# Patient Record
Sex: Female | Born: 1955 | Race: White | Hispanic: No | Marital: Married | Smoking: Never smoker
Health system: Southern US, Community
[De-identification: ages and names within clinical notes are randomized; demographics above are authoritative.]

## PROBLEM LIST (undated history)

## (undated) DIAGNOSIS — M1990 Unspecified osteoarthritis, unspecified site: Secondary | ICD-10-CM

## (undated) DIAGNOSIS — R011 Cardiac murmur, unspecified: Secondary | ICD-10-CM

## (undated) DIAGNOSIS — E119 Type 2 diabetes mellitus without complications: Secondary | ICD-10-CM

## (undated) DIAGNOSIS — I10 Essential (primary) hypertension: Secondary | ICD-10-CM

## (undated) HISTORY — PX: ECTOPIC PREGNANCY SURGERY: SHX613

---

## 2005-08-15 ENCOUNTER — Ambulatory Visit: Payer: Self-pay | Admitting: Unknown Physician Specialty

## 2007-01-15 ENCOUNTER — Ambulatory Visit: Payer: Self-pay | Admitting: Unknown Physician Specialty

## 2007-02-19 ENCOUNTER — Ambulatory Visit: Payer: Self-pay | Admitting: Gastroenterology

## 2008-11-17 ENCOUNTER — Ambulatory Visit: Payer: Self-pay | Admitting: Unknown Physician Specialty

## 2009-11-20 ENCOUNTER — Ambulatory Visit: Payer: Self-pay | Admitting: Unknown Physician Specialty

## 2009-11-23 ENCOUNTER — Ambulatory Visit: Payer: Self-pay | Admitting: Unknown Physician Specialty

## 2010-12-06 ENCOUNTER — Ambulatory Visit: Payer: Self-pay | Admitting: Unknown Physician Specialty

## 2011-05-30 ENCOUNTER — Emergency Department: Payer: Self-pay | Admitting: Emergency Medicine

## 2011-05-30 LAB — COMPREHENSIVE METABOLIC PANEL
Albumin: 4.2 g/dL (ref 3.4–5.0)
BUN: 11 mg/dL (ref 7–18)
Calcium, Total: 9.3 mg/dL (ref 8.5–10.1)
Chloride: 103 mmol/L (ref 98–107)
EGFR (African American): >60
Glucose: 112 mg/dL — ABNORMAL HIGH (ref 65–99)
SGOT(AST): 16 U/L (ref 15–37)
SGPT (ALT): 27 U/L
Total Protein: 7.3 g/dL (ref 6.4–8.2)

## 2011-05-30 LAB — CBC
HCT: 36.9 % (ref 35.0–47.0)
MCH: 28.1 pg (ref 26.0–34.0)
MCHC: 32.9 g/dL (ref 32.0–36.0)
MCV: 86 fL (ref 80–100)
Platelet: 343 10*3/uL (ref 150–440)
RDW: 13.3 % (ref 11.5–14.5)

## 2011-05-30 LAB — TROPONIN I: Troponin-I: <0.02 ng/mL

## 2016-04-04 ENCOUNTER — Other Ambulatory Visit: Payer: Self-pay | Admitting: Family Medicine

## 2016-04-04 DIAGNOSIS — N644 Mastodynia: Secondary | ICD-10-CM

## 2016-04-09 ENCOUNTER — Ambulatory Visit
Admission: RE | Admit: 2016-04-09 | Discharge: 2016-04-09 | Disposition: A | Discharge status: Home / Self Care | Payer: BLUE CROSS/BLUE SHIELD | Source: Ambulatory Visit | Attending: Family Medicine | Admitting: Family Medicine

## 2016-04-09 ENCOUNTER — Other Ambulatory Visit: Payer: Self-pay | Admitting: *Deleted

## 2016-04-09 ENCOUNTER — Ambulatory Visit
Admission: RE | Admit: 2016-04-09 | Discharge: 2016-04-09 | Disposition: A | Discharge status: Home / Self Care | Payer: Self-pay | Source: Ambulatory Visit | Attending: *Deleted | Admitting: *Deleted

## 2016-04-09 DIAGNOSIS — N644 Mastodynia: Secondary | ICD-10-CM

## 2016-04-09 DIAGNOSIS — Z9289 Personal history of other medical treatment: Secondary | ICD-10-CM

## 2016-04-17 ENCOUNTER — Ambulatory Visit: Payer: Self-pay

## 2016-04-17 ENCOUNTER — Other Ambulatory Visit: Payer: Self-pay

## 2017-08-11 ENCOUNTER — Other Ambulatory Visit: Payer: Self-pay | Admitting: Obstetrics and Gynecology

## 2017-08-11 DIAGNOSIS — Z1231 Encounter for screening mammogram for malignant neoplasm of breast: Secondary | ICD-10-CM

## 2017-08-14 ENCOUNTER — Other Ambulatory Visit: Payer: Self-pay | Admitting: Obstetrics & Gynecology

## 2017-08-14 DIAGNOSIS — R1900 Intra-abdominal and pelvic swelling, mass and lump, unspecified site: Secondary | ICD-10-CM

## 2017-08-18 ENCOUNTER — Ambulatory Visit
Admission: RE | Admit: 2017-08-18 | Discharge: 2017-08-18 | Disposition: A | Discharge status: Home / Self Care | Payer: BLUE CROSS/BLUE SHIELD | Source: Ambulatory Visit | Attending: Obstetrics & Gynecology | Admitting: Obstetrics & Gynecology

## 2017-08-18 DIAGNOSIS — R1900 Intra-abdominal and pelvic swelling, mass and lump, unspecified site: Secondary | ICD-10-CM | POA: Diagnosis present

## 2017-08-18 DIAGNOSIS — K760 Fatty (change of) liver, not elsewhere classified: Secondary | ICD-10-CM | POA: Diagnosis not present

## 2017-08-18 DIAGNOSIS — N83201 Unspecified ovarian cyst, right side: Secondary | ICD-10-CM | POA: Diagnosis not present

## 2017-08-18 DIAGNOSIS — N83202 Unspecified ovarian cyst, left side: Secondary | ICD-10-CM | POA: Insufficient documentation

## 2017-08-18 HISTORY — DX: Essential (primary) hypertension: I10

## 2017-08-18 HISTORY — DX: Type 2 diabetes mellitus without complications: E11.9

## 2017-08-18 MED ORDER — IOPAMIDOL (ISOVUE-300) INJECTION 61%
100.0000 mL | Freq: Once | INTRAVENOUS | Status: AC | PRN
  Administered 2017-08-18: 100 mL via INTRAVENOUS

## 2017-08-19 ENCOUNTER — Other Ambulatory Visit: Payer: Self-pay

## 2017-08-19 ENCOUNTER — Ambulatory Visit: Admission: RE | Admit: 2017-08-19 | Payer: BLUE CROSS/BLUE SHIELD | Source: Ambulatory Visit

## 2017-08-19 ENCOUNTER — Encounter
Admission: RE | Admit: 2017-08-19 | Discharge: 2017-08-19 | Disposition: A | Discharge status: Home / Self Care | Payer: BLUE CROSS/BLUE SHIELD | Source: Ambulatory Visit | Attending: Obstetrics & Gynecology | Admitting: Obstetrics & Gynecology

## 2017-08-19 DIAGNOSIS — R9431 Abnormal electrocardiogram [ECG] [EKG]: Secondary | ICD-10-CM | POA: Diagnosis not present

## 2017-08-19 DIAGNOSIS — Z01810 Encounter for preprocedural cardiovascular examination: Secondary | ICD-10-CM | POA: Insufficient documentation

## 2017-08-19 DIAGNOSIS — Z01812 Encounter for preprocedural laboratory examination: Secondary | ICD-10-CM | POA: Diagnosis present

## 2017-08-19 HISTORY — DX: Cardiac murmur, unspecified: R01.1

## 2017-08-19 HISTORY — DX: Unspecified osteoarthritis, unspecified site: M19.90

## 2017-08-19 LAB — TYPE AND SCREEN
ABO/RH(D): A POS
Antibody Screen: NEGATIVE

## 2017-08-19 MED ORDER — CEFAZOLIN SODIUM-DEXTROSE 2-4 GM/100ML-% IV SOLN
2.0000 g | INTRAVENOUS | Status: AC
  Administered 2017-08-20: 2 g via INTRAVENOUS

## 2017-08-19 NOTE — Patient Instructions (Signed)
Your procedure is scheduled on: tomorrow  Report to Day Surgery. 6:00   Remember: Instructions that are not followed completely may result in serious medical risk, up to and including death, or upon the discretion of your surgeon and anesthesiologist your surgery may need to be rescheduled.     _X__ 1. Do not eat food after midnight the night before your procedure.                 No gum chewing or hard candies. You may drink clear liquids up to 2 hours                 before you are scheduled to arrive for your surgery- DO not drink clear                 liquids within 2 hours of the start of your surgery.                 Clear Liquids include:  water, apple juice without pulp, clear carbohydrate                 drink such as Clearfast of Gartorade, Black Coffee or Tea (Do not add                 anything to coffee or tea).  __X__2.  On the morning of surgery brush your teeth with toothpaste and water, you  may rinse your mouth with mouthwash if you wish.  Do not swallow any              toothpaste of mouthwash.     ___ 3.  No Alcohol for 24 hours before or after surgery.   ___ 4.  Do Not Smoke or use e-cigarettes For 24 Hours Prior to Your Surgery.                 Do not use any chewable tobacco products for at least 6 hours prior to                 surgery.  ____  5.  Bring all medications with you on the day of surgery if instructed.   ____  6.  Notify your doctor if there is any change in your medical condition      (cold, fever, infections).     Do not wear jewelry, make-up, hairpins, clips or nail polish. Do not wear lotions, powders, or perfumes. You may wear deodorant. Do not shave 48 hours prior to surgery. Men may shave face and neck. Do not bring valuables to the hospital.    [facility] is not responsible for any belongings or valuables.  Contacts, dentures or bridgework may not be worn into surgery. Leave your suitcase in the car. After surgery  it may be brought to your room. For patients admitted to the hospital, discharge time is determined by your treatment team.   Patients discharged the day of surgery will not be allowed to drive home.   Please read over the following fact sheets that you were given:   _x___ Take these medicines the morning of surgery with A SIP OF WATER:    1. none  2.   3.   4.  5.  6.  ____ Fleet Enema (as directed)   __x__ Use CHG Soap as directed  ____ Use inhalers on the day of surgery  __x__ Stop metformin 2 days prior to surgery ( already stopped)   ____ Take 1/2 of  usual insulin dose the night before surgery. No insulin the morning          of surgery.   __x__ Stop aspirin now  ___x_ Stop Anti-inflammatories ibuprofen now   ___x_ Stop supplements until after surgery.    ____ Bring C-Pap to the hospital.

## 2017-08-20 ENCOUNTER — Other Ambulatory Visit: Payer: Self-pay

## 2017-08-20 ENCOUNTER — Ambulatory Visit: Payer: BLUE CROSS/BLUE SHIELD | Admitting: Certified Registered"

## 2017-08-20 ENCOUNTER — Ambulatory Visit
Admission: RE | Admit: 2017-08-20 | Discharge: 2017-08-20 | Disposition: A | Discharge status: Home / Self Care | Payer: BLUE CROSS/BLUE SHIELD | Source: Ambulatory Visit | Attending: Obstetrics & Gynecology | Admitting: Obstetrics & Gynecology

## 2017-08-20 ENCOUNTER — Encounter: Payer: Self-pay | Admitting: *Deleted

## 2017-08-20 ENCOUNTER — Encounter
Admission: RE | Disposition: A | Discharge status: Home / Self Care | Payer: Self-pay | Source: Ambulatory Visit | Attending: Obstetrics & Gynecology

## 2017-08-20 DIAGNOSIS — Z79899 Other long term (current) drug therapy: Secondary | ICD-10-CM | POA: Diagnosis not present

## 2017-08-20 DIAGNOSIS — N83201 Unspecified ovarian cyst, right side: Secondary | ICD-10-CM

## 2017-08-20 DIAGNOSIS — D271 Benign neoplasm of left ovary: Secondary | ICD-10-CM | POA: Diagnosis not present

## 2017-08-20 DIAGNOSIS — Z7984 Long term (current) use of oral hypoglycemic drugs: Secondary | ICD-10-CM | POA: Diagnosis not present

## 2017-08-20 DIAGNOSIS — N839 Noninflammatory disorder of ovary, fallopian tube and broad ligament, unspecified: Secondary | ICD-10-CM | POA: Diagnosis present

## 2017-08-20 DIAGNOSIS — D270 Benign neoplasm of right ovary: Secondary | ICD-10-CM | POA: Diagnosis not present

## 2017-08-20 DIAGNOSIS — I10 Essential (primary) hypertension: Secondary | ICD-10-CM | POA: Diagnosis not present

## 2017-08-20 DIAGNOSIS — E119 Type 2 diabetes mellitus without complications: Secondary | ICD-10-CM | POA: Diagnosis not present

## 2017-08-20 DIAGNOSIS — Z7982 Long term (current) use of aspirin: Secondary | ICD-10-CM | POA: Insufficient documentation

## 2017-08-20 DIAGNOSIS — N83202 Unspecified ovarian cyst, left side: Secondary | ICD-10-CM

## 2017-08-20 HISTORY — PX: LAPAROSCOPIC UNILATERAL SALPINGECTOMY: SHX5934

## 2017-08-20 LAB — GLUCOSE, CAPILLARY
GLUCOSE-CAPILLARY: 155 mg/dL — AB (ref 65–99)
GLUCOSE-CAPILLARY: 154 mg/dL — AB (ref 65–99)

## 2017-08-20 LAB — ABO/RH: ABO/RH(D): A POS

## 2017-08-20 SURGERY — OOPHORECTOMY, LAPAROSCOPIC
Anesthesia: General | Wound class: Clean Contaminated

## 2017-08-20 MED ORDER — ROCURONIUM BROMIDE 100 MG/10ML IV SOLN
INTRAVENOUS | Status: DC | PRN
  Administered 2017-08-20: 40 mg via INTRAVENOUS
  Administered 2017-08-20: 5 mg via INTRAVENOUS

## 2017-08-20 MED ORDER — MIDAZOLAM HCL 2 MG/2ML IJ SOLN
INTRAMUSCULAR | Status: AC
  Filled 2017-08-20: qty 2

## 2017-08-20 MED ORDER — ONDANSETRON HCL 4 MG/2ML IJ SOLN: 4.0000 mg | Freq: Once | INTRAMUSCULAR | Status: DC | PRN

## 2017-08-20 MED ORDER — PROPOFOL 10 MG/ML IV BOLUS
INTRAVENOUS | Status: DC | PRN
  Administered 2017-08-20: 140 mg via INTRAVENOUS

## 2017-08-20 MED ORDER — ACETAMINOPHEN 500 MG PO TABS
1000.0000 mg | ORAL_TABLET | ORAL | Status: AC
  Administered 2017-08-20: 1000 mg via ORAL

## 2017-08-20 MED ORDER — SEVOFLURANE IN SOLN
RESPIRATORY_TRACT | Status: AC
  Filled 2017-08-20: qty 250

## 2017-08-20 MED ORDER — FAMOTIDINE 20 MG PO TABS
ORAL_TABLET | ORAL | Status: AC
  Filled 2017-08-20: qty 1

## 2017-08-20 MED ORDER — CELECOXIB 200 MG PO CAPS
400.0000 mg | ORAL_CAPSULE | ORAL | Status: AC
  Administered 2017-08-20: 400 mg via ORAL

## 2017-08-20 MED ORDER — LACTATED RINGERS IV SOLN
INTRAVENOUS | Status: DC | PRN
  Administered 2017-08-20: 07:00:00 via INTRAVENOUS

## 2017-08-20 MED ORDER — KETOROLAC TROMETHAMINE 30 MG/ML IJ SOLN
INTRAMUSCULAR | Status: DC | PRN
  Administered 2017-08-20: 30 mg via INTRAVENOUS

## 2017-08-20 MED ORDER — IBUPROFEN 800 MG PO TABS: 800.0000 mg | ORAL_TABLET | Freq: Four times a day (QID) | ORAL | 1 refills | Status: AC

## 2017-08-20 MED ORDER — ACETAMINOPHEN 500 MG PO TABS
ORAL_TABLET | ORAL | Status: AC
  Filled 2017-08-20: qty 2

## 2017-08-20 MED ORDER — OXYCODONE HCL 5 MG PO TABS: 5.0000 mg | ORAL_TABLET | ORAL | 0 refills | Status: DC | PRN

## 2017-08-20 MED ORDER — IBUPROFEN 800 MG PO TABS: 800.0000 mg | ORAL_TABLET | Freq: Four times a day (QID) | ORAL | 1 refills | Status: DC

## 2017-08-20 MED ORDER — HEPARIN SODIUM (PORCINE) 5000 UNIT/ML IJ SOLN
INTRAMUSCULAR | Status: AC
  Filled 2017-08-20: qty 1

## 2017-08-20 MED ORDER — CEFAZOLIN SODIUM-DEXTROSE 2-4 GM/100ML-% IV SOLN
INTRAVENOUS | Status: AC
  Filled 2017-08-20: qty 100

## 2017-08-20 MED ORDER — MORPHINE SULFATE (PF) 4 MG/ML IV SOLN: 1.0000 mg | INTRAVENOUS | Status: DC | PRN

## 2017-08-20 MED ORDER — FENTANYL CITRATE (PF) 250 MCG/5ML IJ SOLN
INTRAMUSCULAR | Status: AC
  Filled 2017-08-20: qty 5

## 2017-08-20 MED ORDER — LIDOCAINE HCL (CARDIAC) PF 100 MG/5ML IV SOSY
PREFILLED_SYRINGE | INTRAVENOUS | Status: DC | PRN
  Administered 2017-08-20: 60 mg via INTRAVENOUS

## 2017-08-20 MED ORDER — DEXAMETHASONE SODIUM PHOSPHATE 10 MG/ML IJ SOLN
4.0000 mg | INTRAMUSCULAR | Status: AC
  Administered 2017-08-20: 4 mg via INTRAVENOUS

## 2017-08-20 MED ORDER — PHENYLEPHRINE HCL 10 MG/ML IJ SOLN
INTRAMUSCULAR | Status: DC | PRN
  Administered 2017-08-20 (×2): 100 ug via INTRAVENOUS
  Administered 2017-08-20: 150 ug via INTRAVENOUS
  Administered 2017-08-20 (×2): 100 ug via INTRAVENOUS
  Administered 2017-08-20: 150 ug via INTRAVENOUS
  Administered 2017-08-20: 100 ug via INTRAVENOUS

## 2017-08-20 MED ORDER — PROPOFOL 10 MG/ML IV BOLUS
INTRAVENOUS | Status: AC
  Filled 2017-08-20: qty 40

## 2017-08-20 MED ORDER — SUGAMMADEX SODIUM 200 MG/2ML IV SOLN
INTRAVENOUS | Status: DC | PRN
  Administered 2017-08-20: 122 mg via INTRAVENOUS

## 2017-08-20 MED ORDER — LACTATED RINGERS IV SOLN: INTRAVENOUS | Status: DC

## 2017-08-20 MED ORDER — FENTANYL CITRATE (PF) 100 MCG/2ML IJ SOLN
INTRAMUSCULAR | Status: DC | PRN
  Administered 2017-08-20 (×2): 50 ug via INTRAVENOUS

## 2017-08-20 MED ORDER — EPHEDRINE SULFATE 50 MG/ML IJ SOLN
INTRAMUSCULAR | Status: DC | PRN
  Administered 2017-08-20: 7.5 mg via INTRAVENOUS

## 2017-08-20 MED ORDER — FAMOTIDINE 20 MG PO TABS
20.0000 mg | ORAL_TABLET | Freq: Once | ORAL | Status: AC
  Administered 2017-08-20: 20 mg via ORAL

## 2017-08-20 MED ORDER — SODIUM CHLORIDE 0.9 % IV SOLN
INTRAVENOUS | Status: DC
  Administered 2017-08-20: 07:00:00 via INTRAVENOUS

## 2017-08-20 MED ORDER — ALBUTEROL SULFATE HFA 108 (90 BASE) MCG/ACT IN AERS
INHALATION_SPRAY | RESPIRATORY_TRACT | Status: DC | PRN
  Administered 2017-08-20: 6 via RESPIRATORY_TRACT

## 2017-08-20 MED ORDER — HEPARIN SODIUM (PORCINE) 5000 UNIT/ML IJ SOLN
5000.0000 [IU] | INTRAMUSCULAR | Status: AC
  Administered 2017-08-20: 5000 [IU] via SUBCUTANEOUS

## 2017-08-20 MED ORDER — OXYCODONE HCL 5 MG PO TABS: 5.0000 mg | ORAL_TABLET | ORAL | 0 refills | Status: AC | PRN

## 2017-08-20 MED ORDER — ONDANSETRON HCL 4 MG/2ML IJ SOLN
INTRAMUSCULAR | Status: DC | PRN
  Administered 2017-08-20: 4 mg via INTRAVENOUS

## 2017-08-20 MED ORDER — MIDAZOLAM HCL 2 MG/2ML IJ SOLN
INTRAMUSCULAR | Status: DC | PRN
  Administered 2017-08-20 (×2): 1 mg via INTRAVENOUS

## 2017-08-20 MED ORDER — FENTANYL CITRATE (PF) 100 MCG/2ML IJ SOLN
INTRAMUSCULAR | Status: AC
  Administered 2017-08-20: 25 ug via INTRAVENOUS
  Filled 2017-08-20: qty 2

## 2017-08-20 MED ORDER — GABAPENTIN 300 MG PO CAPS
ORAL_CAPSULE | ORAL | Status: AC
  Filled 2017-08-20: qty 2

## 2017-08-20 MED ORDER — FENTANYL CITRATE (PF) 100 MCG/2ML IJ SOLN
25.0000 ug | INTRAMUSCULAR | Status: DC | PRN
  Administered 2017-08-20 (×3): 25 ug via INTRAVENOUS

## 2017-08-20 MED ORDER — GABAPENTIN 300 MG PO CAPS
600.0000 mg | ORAL_CAPSULE | ORAL | Status: AC
  Administered 2017-08-20: 600 mg via ORAL

## 2017-08-20 MED ORDER — CELECOXIB 200 MG PO CAPS
ORAL_CAPSULE | ORAL | Status: AC
  Filled 2017-08-20: qty 2

## 2017-08-20 MED ORDER — DEXAMETHASONE SODIUM PHOSPHATE 10 MG/ML IJ SOLN
INTRAMUSCULAR | Status: AC
  Filled 2017-08-20: qty 1

## 2017-08-20 SURGICAL SUPPLY — 65 items
BACTOSHIELD CHG 4% 4OZ (MISCELLANEOUS) ×1
BAG URINE DRAINAGE (UROLOGICAL SUPPLIES) ×5 IMPLANT
BLADE SURG SZ11 CARB STEEL (BLADE) ×5 IMPLANT
CANISTER SUCT 1200ML W/VALVE (MISCELLANEOUS) ×5 IMPLANT
CATH FOLEY 2WAY  5CC 16FR (CATHETERS) ×2
CATH URTH 16FR FL 2W BLN LF (CATHETERS) ×3 IMPLANT
CHLORAPREP W/TINT 26ML (MISCELLANEOUS) ×5 IMPLANT
CNTNR SPEC 2.5X3XGRAD LEK (MISCELLANEOUS) ×9
CONT SPEC 4OZ STER OR WHT (MISCELLANEOUS) ×6
CONTAINER SPEC 2.5X3XGRAD LEK (MISCELLANEOUS) ×9 IMPLANT
DEFOGGER SCOPE WARMER CLEARIFY (MISCELLANEOUS) ×5 IMPLANT
DERMABOND ADVANCED (GAUZE/BANDAGES/DRESSINGS) ×2
DERMABOND ADVANCED .7 DNX12 (GAUZE/BANDAGES/DRESSINGS) ×3 IMPLANT
DEVICE SUTURE ENDOST 10MM (ENDOMECHANICALS) IMPLANT
DRAPE LEGGINS SURG 28X43 STRL (DRAPES) ×5 IMPLANT
DRAPE SHEET LG 3/4 BI-LAMINATE (DRAPES) ×5 IMPLANT
DRAPE UNDER BUTTOCK W/FLU (DRAPES) ×5 IMPLANT
DRSG TELFA 4X3 1S NADH ST (GAUZE/BANDAGES/DRESSINGS) IMPLANT
ELECT REM PT RETURN 9FT ADLT (ELECTROSURGICAL) ×5
ELECTRODE REM PT RTRN 9FT ADLT (ELECTROSURGICAL) ×3 IMPLANT
GLOVE PI ORTHOPRO 6.5 (GLOVE) ×2
GLOVE PI ORTHOPRO STRL 6.5 (GLOVE) ×3 IMPLANT
GLOVE SURG SYN 6.5 ES PF (GLOVE) ×15 IMPLANT
GOWN STRL REUS W/ TWL LRG LVL3 (GOWN DISPOSABLE) ×9 IMPLANT
GOWN STRL REUS W/ TWL XL LVL3 (GOWN DISPOSABLE) ×3 IMPLANT
GOWN STRL REUS W/TWL LRG LVL3 (GOWN DISPOSABLE) ×6
GOWN STRL REUS W/TWL XL LVL3 (GOWN DISPOSABLE) ×2
GRASPER SUT TROCAR 14GX15 (MISCELLANEOUS) ×5 IMPLANT
IRRIGATION STRYKERFLOW (MISCELLANEOUS) IMPLANT
IRRIGATOR STRYKERFLOW (MISCELLANEOUS)
IV LACTATED RINGERS 1000ML (IV SOLUTION) IMPLANT
KIT PINK PAD W/HEAD ARE REST (MISCELLANEOUS) ×5
KIT PINK PAD W/HEAD ARM REST (MISCELLANEOUS) ×3 IMPLANT
KIT TURNOVER CYSTO (KITS) ×5 IMPLANT
L-HOOK LAP DISP 36CM (ELECTROSURGICAL)
LABEL OR SOLS (LABEL) IMPLANT
LHOOK LAP DISP 36CM (ELECTROSURGICAL) IMPLANT
LIGASURE VESSEL 5MM BLUNT TIP (ELECTROSURGICAL) ×5 IMPLANT
MANIPULATOR VCARE LG CRV RETR (MISCELLANEOUS) IMPLANT
MANIPULATOR VCARE SML CRV RETR (MISCELLANEOUS) IMPLANT
MANIPULATOR VCARE STD CRV RETR (MISCELLANEOUS) IMPLANT
NS IRRIG 500ML POUR BTL (IV SOLUTION) ×5 IMPLANT
PACK LAP CHOLECYSTECTOMY (MISCELLANEOUS) ×5 IMPLANT
PAD OB MATERNITY 4.3X12.25 (PERSONAL CARE ITEMS) ×5 IMPLANT
PAD PREP 24X41 OB/GYN DISP (PERSONAL CARE ITEMS) ×5 IMPLANT
PENCIL ELECTRO HAND CTR (MISCELLANEOUS) ×5 IMPLANT
POUCH SPECIMEN RETRIEVAL 10MM (ENDOMECHANICALS) IMPLANT
SCRUB CHG 4% DYNA-HEX 4OZ (MISCELLANEOUS) ×4 IMPLANT
SET CYSTO W/LG BORE CLAMP LF (SET/KITS/TRAYS/PACK) IMPLANT
SET YANKAUER POOLE SUCT (MISCELLANEOUS) IMPLANT
SLEEVE ENDOPATH XCEL 5M (ENDOMECHANICALS) ×5 IMPLANT
STRAP SAFETY 5IN WIDE (MISCELLANEOUS) ×5 IMPLANT
SURGILUBE 2OZ TUBE FLIPTOP (MISCELLANEOUS) ×5 IMPLANT
SUT ENDO VLOC 180-0-8IN (SUTURE) IMPLANT
SUT MNCRL 4-0 (SUTURE) ×2
SUT MNCRL 4-0 27XMFL (SUTURE) ×3
SUT VIC AB 0 CT1 36 (SUTURE) IMPLANT
SUT VIC AB 2-0 SH 27 (SUTURE) ×2
SUT VIC AB 2-0 SH 27XBRD (SUTURE) ×3 IMPLANT
SUTURE MNCRL 4-0 27XMF (SUTURE) ×3 IMPLANT
SYR 10ML LL (SYRINGE) ×5 IMPLANT
TROCAR ENDO BLADELESS 11MM (ENDOMECHANICALS) ×5 IMPLANT
TROCAR XCEL NON-BLD 5MMX100MML (ENDOMECHANICALS) ×5 IMPLANT
TUBING INSUF HEATED (TUBING) IMPLANT
TUBING INSUFFLATION (TUBING) ×5 IMPLANT

## 2017-08-20 NOTE — Anesthesia Preprocedure Evaluation (Signed)
Anesthesia Evaluation  Patient identified by MRN, date of birth, ID band Patient awake    Reviewed: Allergy & Precautions, H&P , NPO status , Patient's Chart, lab work & pertinent test results, reviewed documented beta blocker date and time   Airway Mallampati: II  TM Distance: >3 FB Neck ROM: full    Dental  (+) Teeth Intact   Pulmonary neg pulmonary ROS,    Pulmonary exam normal        Cardiovascular hypertension, negative cardio ROS Normal cardiovascular exam Rhythm:regular Rate:Normal     Neuro/Psych negative neurological ROS  negative psych ROS   GI/Hepatic negative GI ROS, Neg liver ROS,   Endo/Other  negative endocrine ROSdiabetes  Renal/GU negative Renal ROS  negative genitourinary   Musculoskeletal   Abdominal   Peds  Hematology negative hematology ROS (+)   Anesthesia Other Findings Past Medical History: No date: Arthritis No date: Diabetes mellitus without complication (HCC) No date: Heart murmur No date: Hypertension Past Surgical History: No date: ECTOPIC PREGNANCY SURGERY; Left     Comment:  partial loss of tube BMI    Body Mass Index:  25.41 kg/m     Reproductive/Obstetrics negative OB ROS                             Anesthesia Physical Anesthesia Plan  ASA: II  Anesthesia Plan: General ETT   Post-op Pain Management:    Induction:   PONV Risk Score and Plan: 4 or greater  Airway Management Planned:   Additional Equipment:   Intra-op Plan:   Post-operative Plan:   Informed Consent: I have reviewed the patients History and Physical, chart, labs and discussed the procedure including the risks, benefits and alternatives for the proposed anesthesia with the patient or authorized representative who has indicated his/her understanding and acceptance.   Dental Advisory Given  Plan Discussed with: CRNA  Anesthesia Plan Comments:         Anesthesia  Quick Evaluation

## 2017-08-20 NOTE — Anesthesia Post-op Follow-up Note (Signed)
Anesthesia QCDR form completed.        

## 2017-08-20 NOTE — OR Nursing (Signed)
Dr. Leonides Schanz aware [facility] [address] advises they did not receive e-script for ibuprofen and oxycodone.  Advises she will resend.

## 2017-08-20 NOTE — Discharge Instructions (Addendum)
Discharge instructions:  Call office if you have any of the following: fever >101 F, chills, excessive vaginal bleeding, incision drainage or problems, leg pain or redness, or any other concerns.   Activity: Do not lift > 10 lbs for 2 weeks.   No driving on narcotics or until you are confident you can slam on the brakes.   You may feel some pain in your upper right abdomen/rib and right shoulder.  This is from the gas in the abdomen for surgery. This will subside over time, please be patient!  Take 800mg  Ibuprofen and 1000mg  Tylenol around the clock, every 6 hours for at least the first 3-5 days.  After this you can take as needed.  This will help decrease inflammation and promote healing.  The narcotics you'll take just as needed, as they just trick your brain into thinking its not in pain.    Please don't limit yourself in terms of routine activity.  You will be able to do most things, although they may take longer to do or be a little painful.  You can do it!  Don't be a hero, but don't be a wimp either!   AMBULATORY SURGERY  DISCHARGE INSTRUCTIONS   1) The drugs that you were given will stay in your system until tomorrow so for the next 24 hours you should not:  A) Drive an automobile B) Make any legal decisions C) Drink any alcoholic beverage   2) You may resume regular meals tomorrow.  Today it is better to start with liquids and gradually work up to solid foods.  You may eat anything you prefer, but it is better to start with liquids, then soup and crackers, and gradually work up to solid foods.   3) Please notify your doctor immediately if you have any unusual bleeding, trouble breathing, redness and pain at the surgery site, drainage, fever, or pain not relieved by medication.    4) Additional Instructions:        Please contact your physician with any problems or Same Day Surgery at [phone], Monday through Friday 6 am to 4 pm, or [place] at [facility]  number at [phone].

## 2017-08-20 NOTE — OR Nursing (Signed)
Dr. Leonides Schanz in to see pt in posto 1135 am

## 2017-08-20 NOTE — H&P (Signed)
Preoperative History and Physical  Elizabeth Sloan is a 62 y.o. here for surgical management of pelvic pain and bilateral ovarian cysts.   No significant preoperative concerns.  Proposed surgery: BSO with possible TLH, PPLAD, omentectomy based on frozen pathology results.  Past Medical History:  Diagnosis Date  . Arthritis   . Diabetes mellitus without complication ([place])   . Heart murmur   . Hypertension    Past Surgical History:  Procedure Laterality Date  . ECTOPIC PREGNANCY SURGERY Left    partial loss of tube   OB History  No data available  Patient denies any other pertinent gynecologic issues.   No current facility-administered medications on file prior to encounter.    Current Outpatient Medications on File Prior to Encounter  Medication Sig Dispense Refill  . aspirin EC 81 MG tablet Take 81 mg by mouth daily.    . Azilsartan-Chlorthalidone (EDARBYCLOR) 40-12.5 MG TABS Take 1 tablet by mouth daily.    . Coenzyme Q10 (CO Q 10) 100 MG CAPS Take 1,000 mg by mouth daily.    . Empagliflozin-linaGLIPtin (GLYXAMBI) 25-5 MG TABS Take 1 tablet by mouth daily.    . Flaxseed, Linseed, (FLAX SEED OIL) 1300 MG CAPS Take 1,300 mg by mouth daily.    Marland Kitchen GLUCOSAMINE-CHONDROITIN-MSM PO Take 2 tablets by mouth daily.    Marland Kitchen ibuprofen (ADVIL,MOTRIN) 200 MG tablet Take 400 mg by mouth daily.    . metFORMIN (GLUCOPHAGE) 1000 MG tablet Take 1,000 mg by mouth 2 (two) times daily with a meal.    . OVER THE COUNTER MEDICATION Take 1 tablet by mouth daily. Super B Complex With Biotin 1000 mcg    . OVER THE COUNTER MEDICATION Take 2 capsules by mouth daily. Cholestene Supplement    . OVER THE COUNTER MEDICATION Take 1 tablet by mouth daily. Cinnamon Biotin Chromium Supplement    . Turmeric Curcumin 500 MG CAPS Take 500 mg by mouth daily.     Allergies  Allergen Reactions  . Statins Other (See Comments)    Joint pain    Social History:   reports that she has never smoked. She has never used  smokeless tobacco. She reports that she drank alcohol. She reports that she does not use drugs.  Family History  Problem Relation Age of Onset  . Breast cancer Other     Review of Systems: Noncontributory  PHYSICAL EXAM: Blood pressure 110/65, pulse 79, temperature 97.8 F (36.6 C), resp. rate 16, height 5\' 1"  (1.549 m), weight 61 kg (134 lb 8 oz), SpO2 95 %. General appearance - alert, well appearing, and in no distress Chest - clear to auscultation, no wheezes, rales or rhonchi, symmetric air entry Heart - normal rate and regular rhythm Abdomen - soft, nontender, nondistended, no masses or organomegaly Pelvic - examination not indicated Extremities - peripheral pulses normal, no pedal edema, no clubbing or cyanosis  Labs: Results for orders placed or performed during the hospital encounter of 08/20/17 (from the past 336 hour(s))  Glucose, capillary   Collection Time: 08/20/17  6:17 AM  Result Value Ref Range   Glucose-Capillary 155 (H) 65 - 99 mg/dL  ABO/Rh   Collection Time: 08/20/17  6:49 AM  Result Value Ref Range   ABO/RH(D)      A POS Performed at [facility], [address] [place], [place] [address]   Results for orders placed or performed during the hospital encounter of 08/19/17 (from the past 336 hour(s))  Type and screen [place]   Collection Time: 08/19/17  1:36 PM  Result Value Ref Range   ABO/RH(D) A POS    Antibody Screen NEG    Sample Expiration 09/02/2017    Extend sample reason      NO TRANSFUSIONS OR PREGNANCY IN THE PAST 3 MONTHS Performed at [facility], [address] [place], [place] [address]     Imaging Studies: Ct Chest W Contrast  Result Date: 08/18/2017 CLINICAL DATA:  Lower pelvic pain, ovarian cysts. Hx of partial resection of fallopian tube due to ectopic pregnancy. laparoscopic surgeries for endometriosis noted. Pt scheduled for pelvic surgery Wednesday EXAM: CT CHEST, ABDOMEN, AND PELVIS  WITH CONTRAST TECHNIQUE: Multidetector CT imaging of the chest, abdomen and pelvis was performed following the standard protocol during bolus administration of intravenous contrast. CONTRAST:  152mL ISOVUE-300 IOPAMIDOL (ISOVUE-300) INJECTION 61% COMPARISON:  None. FINDINGS: CT CHEST FINDINGS Cardiovascular: No significant vascular findings. Normal heart size. No pericardial effusion. Mediastinum/Nodes: No enlarged mediastinal, hilar, or axillary lymph nodes. Thyroid gland, trachea, and esophagus demonstrate no significant findings. Lungs/Pleura: No focal consolidation. 3 mm right middle lobe pulmonary nodule. No pleural effusion or pneumothorax. Musculoskeletal: No chest wall mass or suspicious bone lesions identified. All CT ABDOMEN PELVIS FINDINGS Hepatobiliary: Diffuse low attenuation of the liver as can be seen with hepatic steatosis. No focal liver abnormality is seen. No gallstones, gallbladder wall thickening, or biliary dilatation. Pancreas: Unremarkable. No pancreatic ductal dilatation or surrounding inflammatory changes. Spleen: Normal in size without focal abnormality. Adrenals/Urinary Tract: Adrenal glands are unremarkable. 10 mm left renal cyst. Kidneys are otherwise normal, without renal calculi, solid renal mass, or hydronephrosis. Bladder is unremarkable. Stomach/Bowel: Stomach is within normal limits. Appendix appears normal. No evidence of bowel wall thickening, distention, or inflammatory changes. Vascular/Lymphatic: No significant vascular findings are present. No enlarged abdominal or pelvic lymph nodes. Reproductive: 4.6 x 6.5 x 4.6 cm fluid attenuating left ovarian mass most consistent with a cyst with a punctate mural calcification. 3.1 x 2.6 x 3.5 cm hypodense, fluid attenuating right ovarian mass most consistent with a cyst. Normal uterus. Other: No abdominal wall hernia or abnormality. No abdominopelvic ascites. Musculoskeletal: No acute osseous abnormality. No aggressive osseous lesion.  Grade 1 anterolisthesis of L4 on L5 secondary to severe bilateral facet arthropathy. Bilateral facet arthropathy at L5-S1. IMPRESSION: 1. 4.6 x 6.5 x 4.6 cm left ovarian cystic mass with a punctate mural calcification. 2. 3.1 x 2.6 x 3.5 cm right ovarian cystic mass likely reflecting a simple cyst. 3. Hepatic steatosis. Electronically Signed   By: Kathreen Devoid   On: 08/18/2017 14:32   Ct Abdomen Pelvis W Contrast  Result Date: 08/18/2017 CLINICAL DATA:  Lower pelvic pain, ovarian cysts. Hx of partial resection of fallopian tube due to ectopic pregnancy. laparoscopic surgeries for endometriosis noted. Pt scheduled for pelvic surgery Wednesday EXAM: CT CHEST, ABDOMEN, AND PELVIS WITH CONTRAST TECHNIQUE: Multidetector CT imaging of the chest, abdomen and pelvis was performed following the standard protocol during bolus administration of intravenous contrast. CONTRAST:  133mL ISOVUE-300 IOPAMIDOL (ISOVUE-300) INJECTION 61% COMPARISON:  None. FINDINGS: CT CHEST FINDINGS Cardiovascular: No significant vascular findings. Normal heart size. No pericardial effusion. Mediastinum/Nodes: No enlarged mediastinal, hilar, or axillary lymph nodes. Thyroid gland, trachea, and esophagus demonstrate no significant findings. Lungs/Pleura: No focal consolidation. 3 mm right middle lobe pulmonary nodule. No pleural effusion or pneumothorax. Musculoskeletal: No chest wall mass or suspicious bone lesions identified. All CT ABDOMEN PELVIS FINDINGS Hepatobiliary: Diffuse low attenuation of the liver  as can be seen with hepatic steatosis. No focal liver abnormality is seen. No gallstones, gallbladder wall thickening, or biliary dilatation. Pancreas: Unremarkable. No pancreatic ductal dilatation or surrounding inflammatory changes. Spleen: Normal in size without focal abnormality. Adrenals/Urinary Tract: Adrenal glands are unremarkable. 10 mm left renal cyst. Kidneys are otherwise normal, without renal calculi, solid renal mass, or  hydronephrosis. Bladder is unremarkable. Stomach/Bowel: Stomach is within normal limits. Appendix appears normal. No evidence of bowel wall thickening, distention, or inflammatory changes. Vascular/Lymphatic: No significant vascular findings are present. No enlarged abdominal or pelvic lymph nodes. Reproductive: 4.6 x 6.5 x 4.6 cm fluid attenuating left ovarian mass most consistent with a cyst with a punctate mural calcification. 3.1 x 2.6 x 3.5 cm hypodense, fluid attenuating right ovarian mass most consistent with a cyst. Normal uterus. Other: No abdominal wall hernia or abnormality. No abdominopelvic ascites. Musculoskeletal: No acute osseous abnormality. No aggressive osseous lesion. Grade 1 anterolisthesis of L4 on L5 secondary to severe bilateral facet arthropathy. Bilateral facet arthropathy at L5-S1. IMPRESSION: 1. 4.6 x 6.5 x 4.6 cm left ovarian cystic mass with a punctate mural calcification. 2. 3.1 x 2.6 x 3.5 cm right ovarian cystic mass likely reflecting a simple cyst. 3. Hepatic steatosis. Electronically Signed   By: Kathreen Devoid   On: 08/18/2017 14:32    Assessment: Patient Active Problem List   Diagnosis Date Noted  . Ovarian cyst, bilateral 08/20/2017    Plan: Patient will undergo surgical management with lap BSO.   The risks of surgery were discussed in detail with the patient including but not limited to: bleeding which may require transfusion or reoperation; infection which may require antibiotics; injury to surrounding organs which may involve bowel, bladder, ureters ; need for additional procedures including laparoscopy or laparotomy; thromboembolic phenomenon, surgical site problems and other postoperative/anesthesia complications. Likelihood of success in alleviating the patient's condition was discussed. Routine postoperative instructions will be reviewed with the patient and her family in detail after surgery.  The patient concurred with the proposed plan, giving informed written  consent for the surgery.  Patient has been NPO since last night she will remain NPO for procedure.  Anesthesia and OR aware.  Preoperative prophylactic antibiotics and SCDs ordered on call to the OR.  To OR when ready.   ----- Larey Days, MD Attending Obstetrician and Gynecologist [facility], Department of [facility]

## 2017-08-20 NOTE — Op Note (Signed)
Elizabeth Sloan PROCEDURE DATE: 08/20/2017  PATIENT:  Elizabeth Sloan  62 y.o. female  PRE-OPERATIVE DIAGNOSIS:  ovarian mass  POST-OPERATIVE DIAGNOSIS:  ovarian mass  PROCEDURE:  Procedure(s): LAPAROSCOPIC OOPHORECTOMY (Bilateral) LAPAROSCOPIC UNILATERAL SALPINGECTOMY (Left)  SURGEON:  Surgeon(s) and Role:    * Britania Shreeve, Honor Loh, MD - Primary Marybeth RN- FA student  ANESTHESIA:  General via ET  I/O  Total I/O In: 800 [I.V.:800] Out: 420 [Urine:400; Blood:20]  FINDINGS:  Small uterus, enlarged bilateral ovaries, absent right fallopian tube. Normal upper abdomen with filmy adhesion on left liver. Stenotic cervical os.  SPECIMEN:  1. Pelvic washings 2. Right ovary 3. Left tube and ovary  Frozen pathology:  Benign ovarian cysts bilaterally  COMPLICATIONS: none apparent  DISPOSITION: vital signs stable to PACU   Indication for Surgery: 61 y.o. with recent onset left sided pelvic pain, found to have a 7cm left ovarian cyst and 3-4cm right ovarian cyst.  CA 125 is 11 and CT C/A/P did not show any pathology other than bilateral cysts.  Risks of surgery were discussed with the patient including but not limited to: bleeding which may require transfusion or reoperation; infection which may require antibiotics; injury to bowel, bladder, ureters or other surrounding organs; need for additional procedures including laparotomy, blood clot, incisional problems and other postoperative/anesthesia complications. Written informed consent was obtained.     PROCEDURE IN DETAIL:  The patient had sequential compression devices applied to her lower extremities while in the preoperative area.  Heparin SQ was given. She was then taken to the operating room where general anesthesia was administered.  She was placed in the dorsal lithotomy position, and was prepped and draped in a sterile manner.  After a surgical timeout was performed, a Foley catheter was inserted into her bladder and attached to  constant drainage. The cervical os was stenotic and no instrument could be passed; a sponge stick was placed in the vagina.  Attention was turned to the abdomen where an umbilical incision was made with the scalpel. An 64mm trochar was inserted in the umbilical incision using a visiport method. Opening pressure was 10mmHg, and the abdomen was insufflated to 15mg Hg carbon dioxide gas and adequate pneumoperitoneum was obtained.  A survey of the patient's pelvis and abdomen revealed the findings as mentioned above. Two 53mm ports were inserted in the lower left and right quadrants under visualization.  Pelvic washings were obtained.  The bilateral ureters were identified and were vermiculating.  The right utero-ovarian ligament was sealed and divided using the Ligasure.  The peritoneum surrounding the ovary was transected, and the IP was thrice cauterized and transected using the Ligasure.  The ovary was placed in the pelvis.  The left IP was identified, thrice cauterized and divided.  The surrounding peritoneum and utero-ovarian ligament were divided.  The surgical sites were hemostatic.  The endocatch bag was inserted into the pelvis and both specimens were place within.  The bag was brought to the surface, and the cysts were punctured and drained, contained within the bag with no spillage.  Each ovary was removed and handed off to nursing separately, and sent to frozen pathology.  The operative site was surveyed, and it was found to be hemostatic.  No intraoperative injury to surrounding organs was noted.  The 07PX umbilical port site was closed with the inlet closure device and 2-0 vicryl.  The abdomen was desufflated and all instruments were then removed from the patient's abdomen.  All skin incisions were closed with  4-0 monocryl and covered with surgical glue. The sponge stick and foley were removed.The patient tolerated the procedures well.  All instruments, needles, and sponge counts were correct x 2. The  patient was taken to the recovery room in stable condition.   ---- Larey Days, MD Attending Obstetrician and [facility]

## 2017-08-20 NOTE — Anesthesia Procedure Notes (Signed)
Procedure Name: Intubation Date/Time: 08/20/2017 7:43 AM Performed by: Chanetta Marshall, CRNA Pre-anesthesia Checklist: Patient identified, Emergency Drugs available, Suction available and Patient being monitored Patient Re-evaluated:Patient Re-evaluated prior to induction Oxygen Delivery Method: Circle system utilized Preoxygenation: Pre-oxygenation with 100% oxygen Induction Type: IV induction Ventilation: Mask ventilation without difficulty Laryngoscope Size: McGraph and 3 Grade View: Grade I Tube type: Oral Number of attempts: 1 Airway Equipment and Method: Patient positioned with wedge pillow Secured at: 22 cm Tube secured with: Tape Dental Injury: Teeth and Oropharynx as per pre-operative assessment

## 2017-08-20 NOTE — Transfer of Care (Signed)
Immediate Anesthesia Transfer of Care Note  Patient: Elizabeth Sloan  Procedure(s) Performed: LAPAROSCOPIC OOPHORECTOMY (Bilateral ) LAPAROSCOPIC UNILATERAL SALPINGECTOMY (Left )  Patient Location: PACU  Anesthesia Type:General  Level of Consciousness: awake, alert  and oriented  Airway & Oxygen Therapy: Patient Spontanous Breathing and Patient connected to face mask oxygen  Post-op Assessment: Report given to RN and Post -op Vital signs reviewed and stable  Post vital signs: Reviewed and stable  Last Vitals:  Vitals Value Taken Time  BP 112/48 08/20/2017  9:36 AM  Temp    Pulse 96 08/20/2017  9:37 AM  Resp 15 08/20/2017  9:37 AM  SpO2 97 % 08/20/2017  9:37 AM  Vitals shown include unvalidated device data.  Last Pain:  Vitals:   08/20/17 0936  TempSrc: Temporal  PainSc:          Complications: No apparent anesthesia complications

## 2017-08-21 LAB — SURGICAL PATHOLOGY

## 2017-08-21 LAB — CYTOLOGY - NON PAP

## 2017-08-26 ENCOUNTER — Ambulatory Visit
Admission: RE | Admit: 2017-08-26 | Discharge: 2017-08-26 | Disposition: A | Discharge status: Home / Self Care | Payer: BLUE CROSS/BLUE SHIELD | Source: Ambulatory Visit | Attending: Obstetrics and Gynecology | Admitting: Obstetrics and Gynecology

## 2017-08-26 DIAGNOSIS — Z1231 Encounter for screening mammogram for malignant neoplasm of breast: Secondary | ICD-10-CM | POA: Insufficient documentation

## 2017-08-28 NOTE — Anesthesia Postprocedure Evaluation (Signed)
Anesthesia Post Note  Patient: Elizabeth Sloan  Procedure(s) Performed: LAPAROSCOPIC OOPHORECTOMY (Bilateral ) LAPAROSCOPIC UNILATERAL SALPINGECTOMY (Left )  Patient location during evaluation: PACU Anesthesia Type: General Level of consciousness: awake and alert Pain management: pain level controlled Vital Signs Assessment: post-procedure vital signs reviewed and stable Respiratory status: spontaneous breathing, nonlabored ventilation, respiratory function stable and patient connected to nasal cannula oxygen Cardiovascular status: blood pressure returned to baseline and stable Postop Assessment: no apparent nausea or vomiting Anesthetic complications: no     Last Vitals:  Vitals:   08/20/17 1126 08/20/17 1151  BP: (!) 104/56 107/85  Pulse: 85 86  Resp: 16 16  Temp: (!) 36.3 C   SpO2: 93% 95%    Last Pain:  Vitals:   08/21/17 0826  TempSrc:   PainSc: [place]

## 2019-01-26 ENCOUNTER — Other Ambulatory Visit: Payer: Self-pay | Admitting: Family Medicine

## 2019-01-26 DIAGNOSIS — Z1231 Encounter for screening mammogram for malignant neoplasm of breast: Secondary | ICD-10-CM

## 2019-02-03 ENCOUNTER — Other Ambulatory Visit: Payer: Self-pay

## 2019-02-03 ENCOUNTER — Ambulatory Visit
Admission: RE | Admit: 2019-02-03 | Discharge: 2019-02-03 | Disposition: A | Discharge status: Home / Self Care | Payer: Medicare Other | Source: Ambulatory Visit | Attending: Family Medicine | Admitting: Family Medicine

## 2019-02-03 DIAGNOSIS — Z1231 Encounter for screening mammogram for malignant neoplasm of breast: Secondary | ICD-10-CM

## 2019-04-23 ENCOUNTER — Ambulatory Visit: Payer: Medicare Other | Attending: Internal Medicine

## 2019-04-23 DIAGNOSIS — Z23 Encounter for immunization: Secondary | ICD-10-CM | POA: Insufficient documentation

## 2019-04-23 NOTE — Progress Notes (Signed)
   Covid-19 Vaccination Clinic  Name:  Elizabeth Sloan    MRN: [id number] DOB: 1956-01-27  04/23/2019  [name] was observed post Covid-19 immunization for 15 minutes without incidence. She was provided with Vaccine Information Sheet and instruction to access the V-Safe system.   [name] was instructed to call 911 with any severe reactions post vaccine: Marland Kitchen Difficulty breathing  . Swelling of your face and throat  . A fast heartbeat  . A bad rash all over your body  . Dizziness and weakness    Immunizations Administered    Name Date Dose VIS Date Route   Pfizer COVID-19 Vaccine 04/23/2019 11:19 AM 0.3 mL 02/19/2019 Intramuscular   Manufacturer: [employer], [employer]   Lot: [id number]   [place]: [id number]

## 2019-05-18 ENCOUNTER — Ambulatory Visit: Payer: Medicare Other | Attending: Internal Medicine

## 2019-05-18 DIAGNOSIS — Z23 Encounter for immunization: Secondary | ICD-10-CM

## 2019-05-18 NOTE — Progress Notes (Signed)
   Covid-19 Vaccination Clinic  Name:  Elizabeth Sloan    MRN: [id number] DOB: 1955-06-27  05/18/2019  [name] was observed post Covid-19 immunization for 15 minutes without incident. She was provided with Vaccine Information Sheet and instruction to access the V-Safe system.   [name] was instructed to call 911 with any severe reactions post vaccine: Marland Kitchen Difficulty breathing  . Swelling of face and throat  . A fast heartbeat  . A bad rash all over body  . Dizziness and weakness   Immunizations Administered    Name Date Dose VIS Date Route   Pfizer COVID-19 Vaccine 05/18/2019  1:21 PM 0.3 mL 02/19/2019 Intramuscular   Manufacturer: [place]   Lot: [id number]   [place]: [id number]

## 2019-05-19 ENCOUNTER — Ambulatory Visit: Payer: Medicare Other

## 2019-12-21 ENCOUNTER — Other Ambulatory Visit: Payer: Self-pay | Admitting: Family Medicine

## 2019-12-21 DIAGNOSIS — Z1231 Encounter for screening mammogram for malignant neoplasm of breast: Secondary | ICD-10-CM

## 2020-02-04 ENCOUNTER — Ambulatory Visit: Payer: Medicare Other

## 2020-02-10 ENCOUNTER — Other Ambulatory Visit: Payer: Self-pay

## 2020-02-10 ENCOUNTER — Ambulatory Visit
Admission: RE | Admit: 2020-02-10 | Discharge: 2020-02-10 | Disposition: A | Discharge status: Home / Self Care | Payer: Medicare Other | Source: Ambulatory Visit | Attending: Family Medicine | Admitting: Family Medicine

## 2020-02-10 DIAGNOSIS — Z1231 Encounter for screening mammogram for malignant neoplasm of breast: Secondary | ICD-10-CM

## 2020-12-28 ENCOUNTER — Other Ambulatory Visit: Payer: Self-pay | Admitting: Family Medicine

## 2021-03-13 ENCOUNTER — Other Ambulatory Visit: Payer: Self-pay | Admitting: Family Medicine

## 2021-03-13 DIAGNOSIS — Z1231 Encounter for screening mammogram for malignant neoplasm of breast: Secondary | ICD-10-CM

## 2021-03-22 ENCOUNTER — Other Ambulatory Visit: Payer: Self-pay

## 2021-03-22 ENCOUNTER — Ambulatory Visit
Admission: RE | Admit: 2021-03-22 | Discharge: 2021-03-22 | Disposition: A | Discharge status: Home / Self Care | Payer: Managed Care, Other (non HMO) | Source: Ambulatory Visit | Attending: Family Medicine | Admitting: Family Medicine

## 2021-03-22 DIAGNOSIS — Z1231 Encounter for screening mammogram for malignant neoplasm of breast: Secondary | ICD-10-CM | POA: Insufficient documentation

## 2022-02-06 ENCOUNTER — Other Ambulatory Visit: Payer: Self-pay | Admitting: Family Medicine

## 2022-02-06 DIAGNOSIS — Z1231 Encounter for screening mammogram for malignant neoplasm of breast: Secondary | ICD-10-CM

## 2022-03-25 ENCOUNTER — Ambulatory Visit
Admission: RE | Admit: 2022-03-25 | Discharge: 2022-03-25 | Disposition: A | Discharge status: Home / Self Care | Payer: Managed Care, Other (non HMO) | Source: Ambulatory Visit | Attending: Family Medicine | Admitting: Family Medicine

## 2022-03-25 DIAGNOSIS — Z1231 Encounter for screening mammogram for malignant neoplasm of breast: Secondary | ICD-10-CM | POA: Diagnosis present

## 2022-10-31 IMAGING — MG DIGITAL SCREENING BILAT W/ TOMO W/ CAD
6 of 10 series · 6 of 30 positions shown · non-contrast
Comparison: Previous exam(s).

CLINICAL DATA: Screening.

EXAM:
DIGITAL SCREENING BILATERAL MAMMOGRAM WITH TOMO AND CAD

[R MLO synth-2D (1 of 2)]
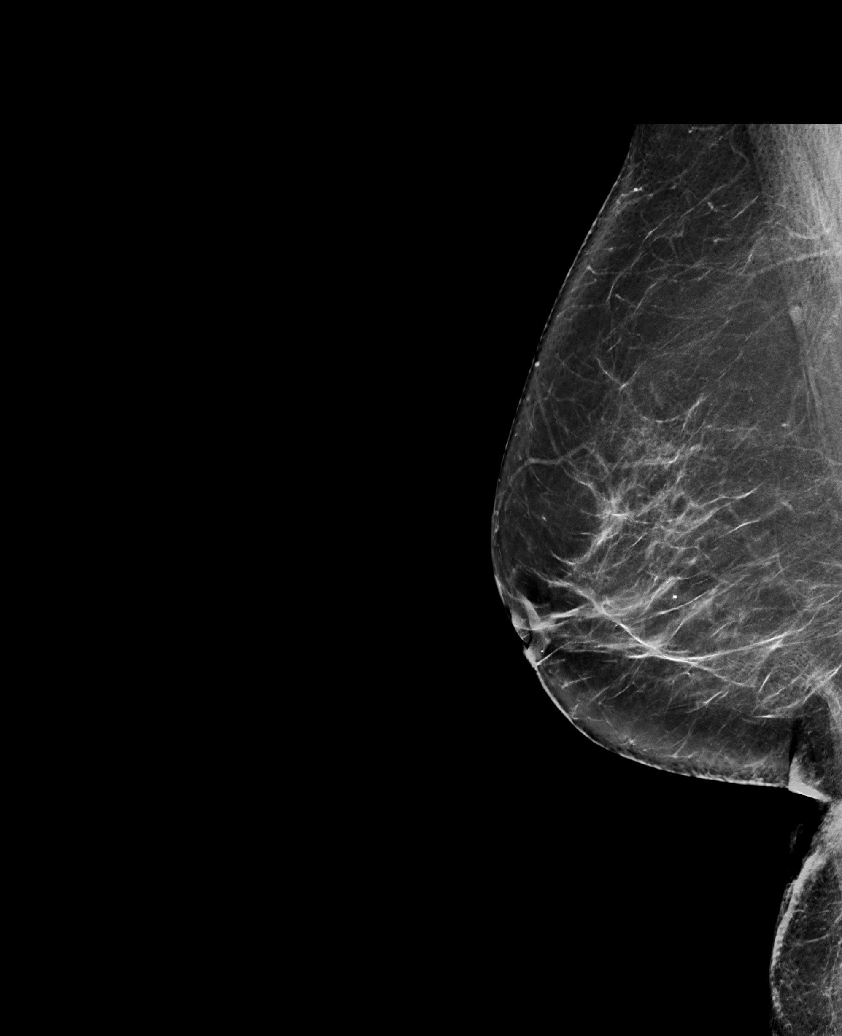

[L CC synth-2D]
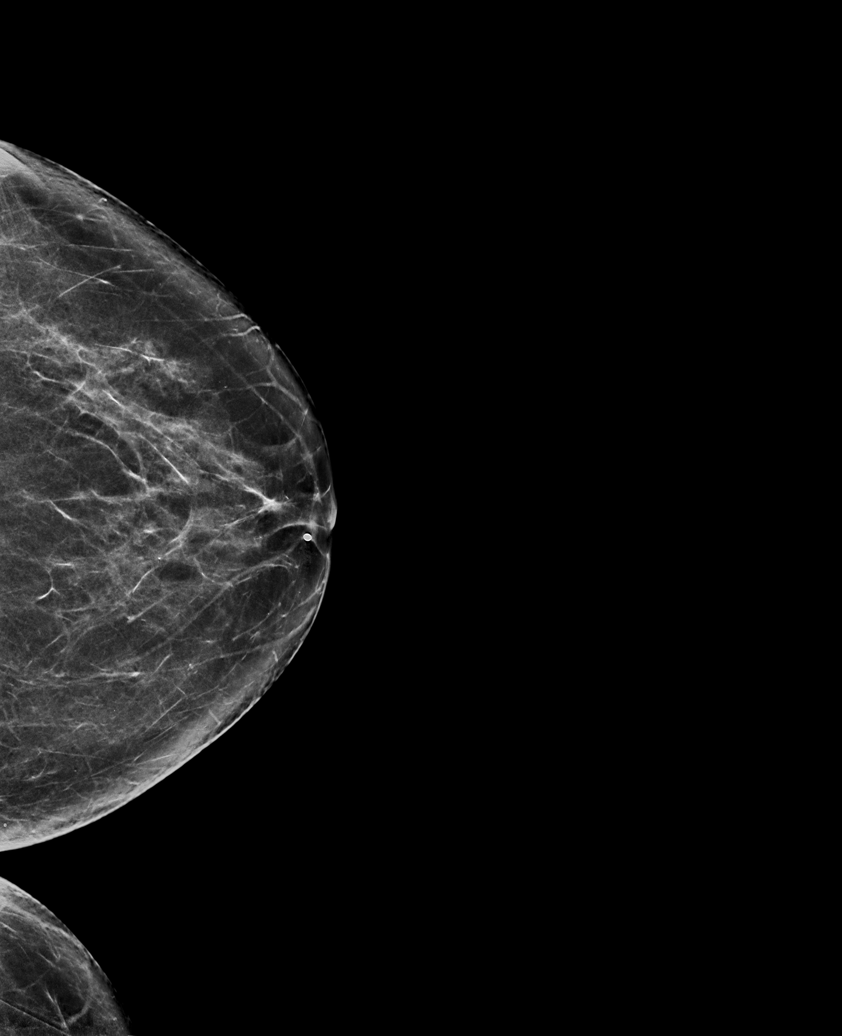

[R CC synth-2D]
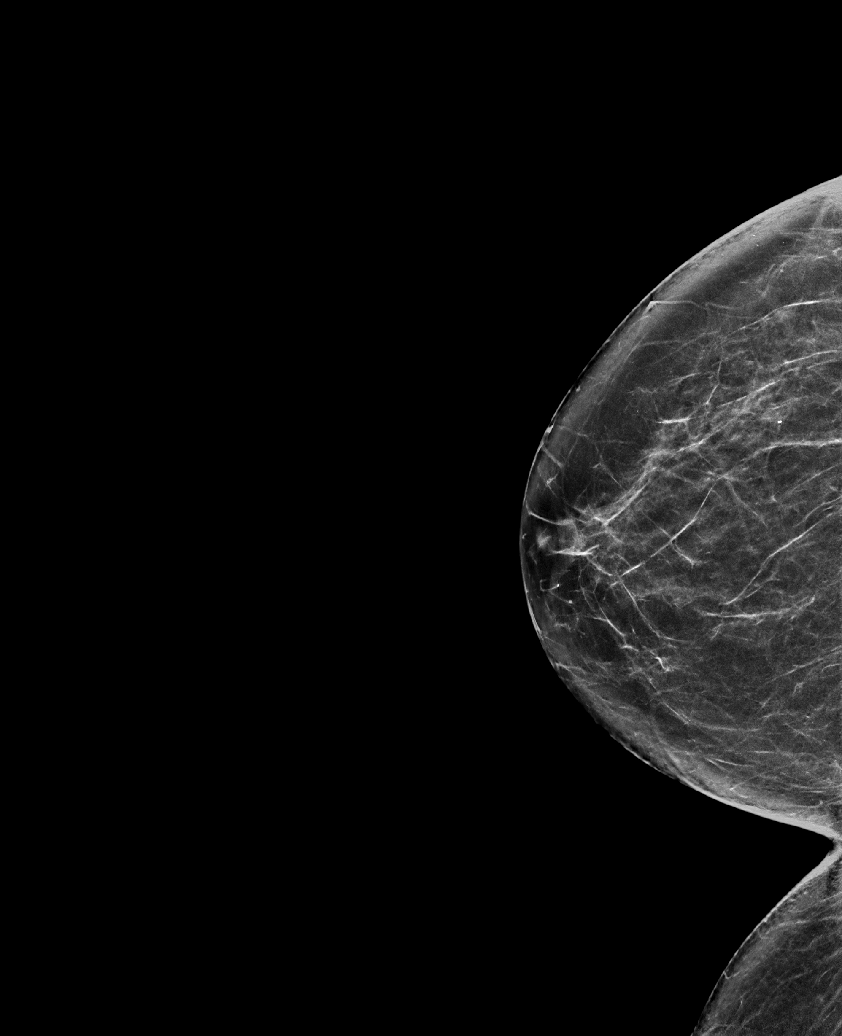

[L MLO synth-2D]
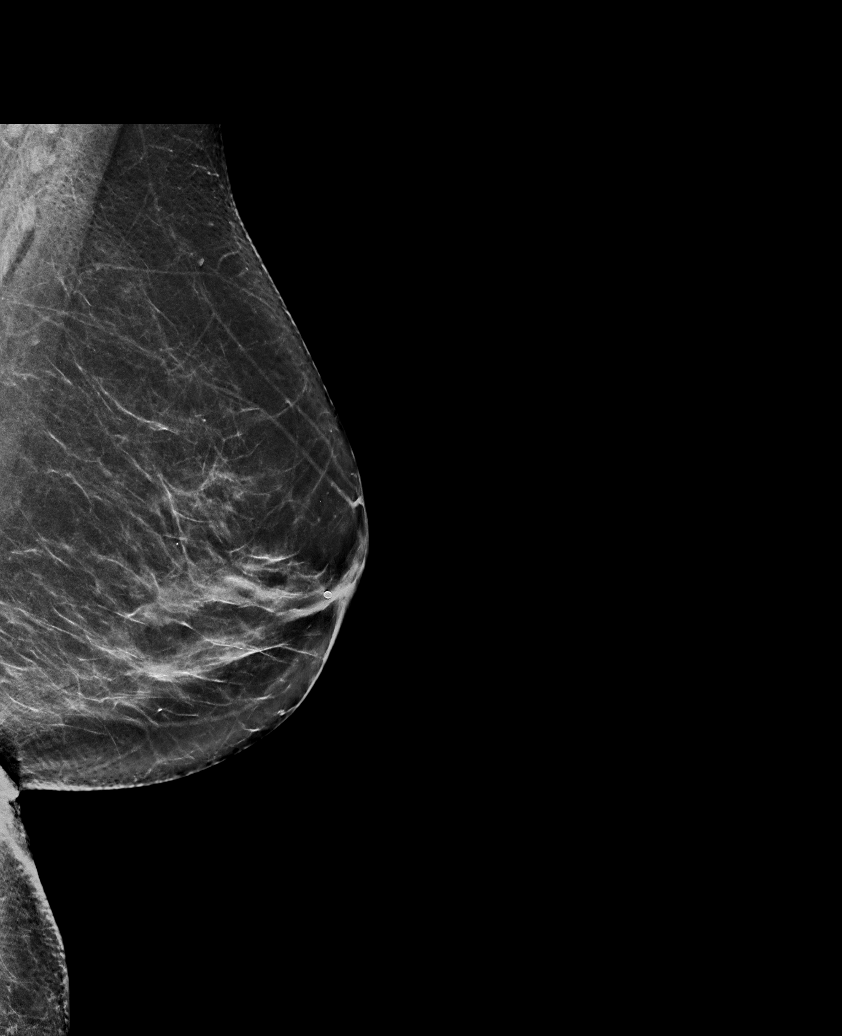

[R MLO synth-2D (2 of 2)]
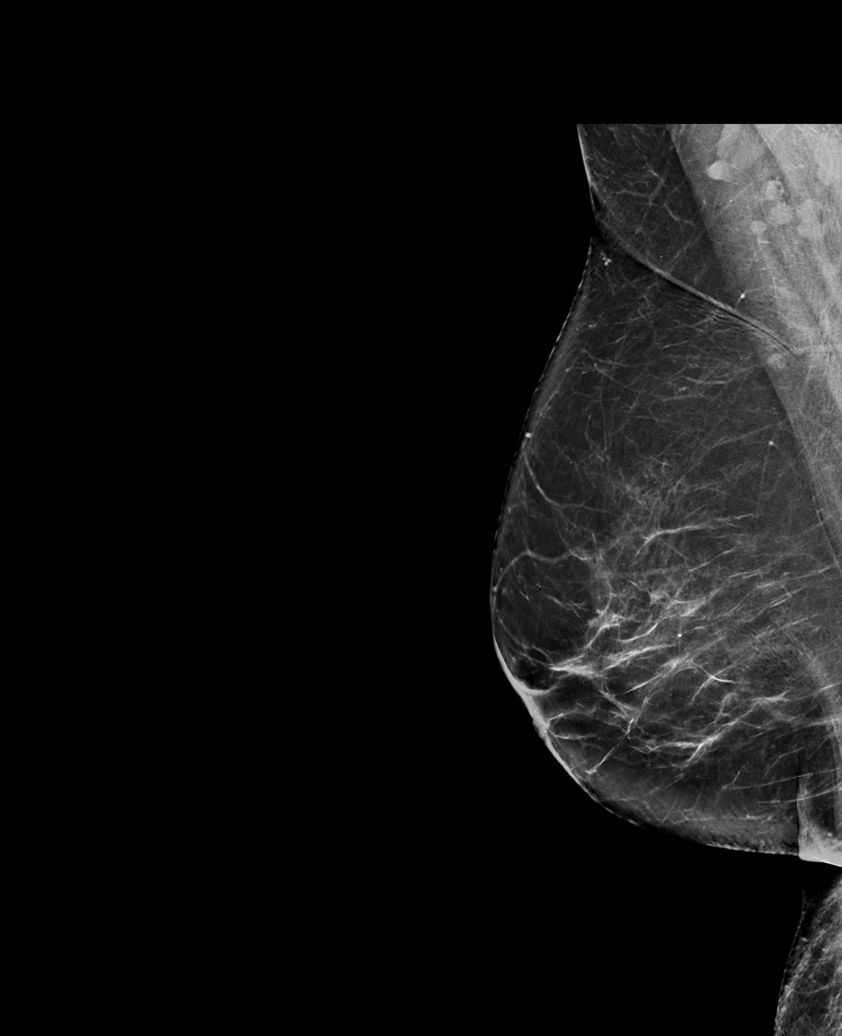

[R MLO tomo · tomo slice 39/77.0]
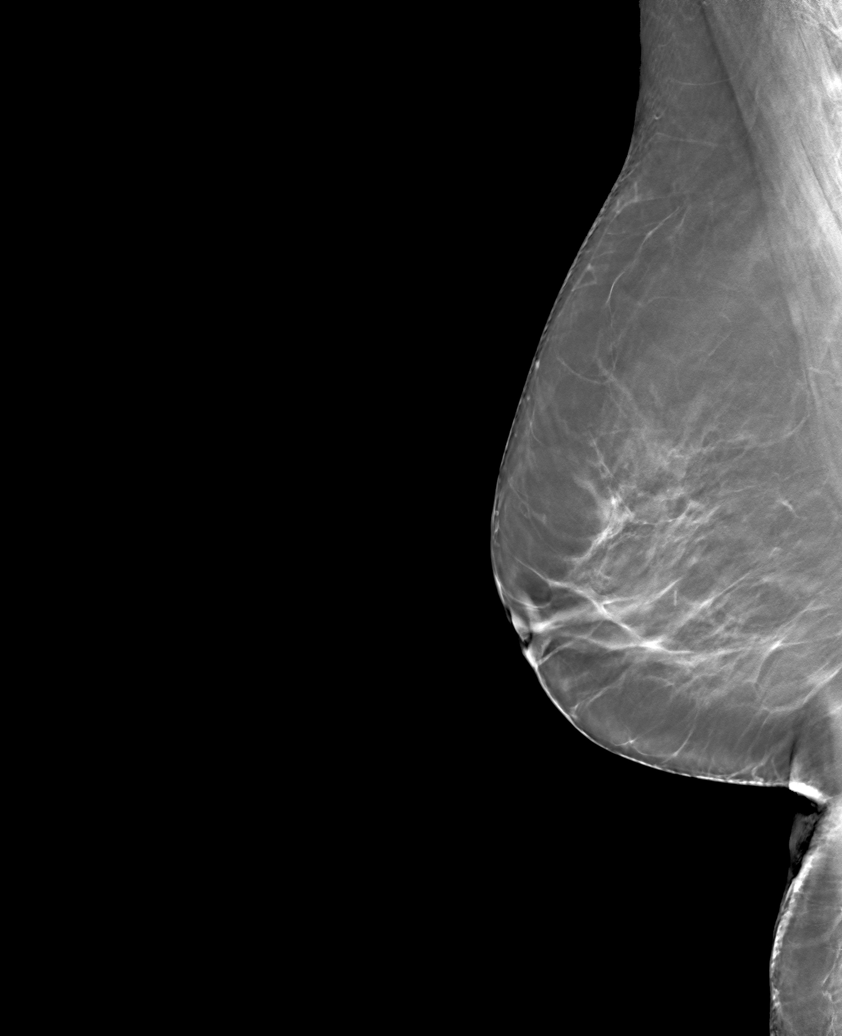

[6 of 30 positions shown; findings below may reference images not displayed]

ACR Breast Density Category b: There are scattered areas of
fibroglandular density.
FINDINGS: There are no findings suspicious for malignancy. Images were
processed with CAD.
IMPRESSION: No mammographic evidence of malignancy. A result letter of this
screening mammogram will be mailed directly to the patient.

RECOMMENDATION:
Screening mammogram in one year. (Code:[id number])

BI-RADS CATEGORY  1: Negative.

## 2022-11-06 ENCOUNTER — Other Ambulatory Visit: Payer: Self-pay | Admitting: Internal Medicine

## 2022-11-06 DIAGNOSIS — R079 Chest pain, unspecified: Secondary | ICD-10-CM

## 2022-11-07 ENCOUNTER — Ambulatory Visit
Admission: RE | Admit: 2022-11-07 | Discharge: 2022-11-07 | Disposition: A | Discharge status: Home / Self Care | Payer: Medicare Other | Source: Ambulatory Visit | Attending: Internal Medicine | Admitting: Internal Medicine

## 2022-11-07 DIAGNOSIS — R079 Chest pain, unspecified: Secondary | ICD-10-CM | POA: Insufficient documentation

## 2022-11-07 MED ORDER — TECHNETIUM TC 99M TETROFOSMIN IV KIT
30.0000 | PACK | Freq: Once | INTRAVENOUS | Status: AC
  Administered 2022-11-07: 31.33 via INTRAVENOUS

## 2022-11-07 MED ORDER — TECHNETIUM TC 99M TETROFOSMIN IV KIT
10.0000 | PACK | Freq: Once | INTRAVENOUS | Status: AC
  Administered 2022-11-07: 10.65 via INTRAVENOUS

## 2022-11-11 LAB — NM MYOCAR MULTI W/SPECT W/WALL MOTION / EF
Angina Index: 0
Base ST Depression (mm): 0 mm
Duke Treadmill Score: 6
Estimated workload: 7
Exercise duration (min): 6 min
Exercise duration (sec): 0 s
LV dias vol: 32 mL (ref 46–106)
LV sys vol: 3 mL
MPHR: 151 {beats}/min
Nuc Stress EF: 91 %
Peak HR: 139 {beats}/min
Percent HR: 92 %
Rest HR: 70 {beats}/min
Rest Nuclear Isotope Dose: 10.7 mCi
SDS: 0
SRS: 0
SSS: 0
ST Depression (mm): 0 mm
Stress Nuclear Isotope Dose: 31.3 mCi
TID: 0.69

## 2023-07-22 ENCOUNTER — Other Ambulatory Visit: Payer: Self-pay | Admitting: Family Medicine

## 2023-07-22 DIAGNOSIS — Z1231 Encounter for screening mammogram for malignant neoplasm of breast: Secondary | ICD-10-CM

## 2023-08-12 ENCOUNTER — Ambulatory Visit
Admission: RE | Admit: 2023-08-12 | Discharge: 2023-08-12 | Disposition: A | Discharge status: Home / Self Care | Source: Ambulatory Visit | Attending: Family Medicine | Admitting: Family Medicine

## 2023-08-12 DIAGNOSIS — Z1231 Encounter for screening mammogram for malignant neoplasm of breast: Secondary | ICD-10-CM | POA: Diagnosis present
